# Patient Record
Sex: Female | Born: 1995 | Race: Black or African American | Hispanic: No | Marital: Single | Smoking: Never smoker
Health system: Southern US, Community
[De-identification: ages and names within clinical notes are randomized; demographics above are authoritative.]

---
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
## 2019-06-12 ENCOUNTER — Emergency Department (HOSPITAL_BASED_OUTPATIENT_CLINIC_OR_DEPARTMENT_OTHER): Payer: Self-pay

## 2019-06-12 ENCOUNTER — Emergency Department (HOSPITAL_BASED_OUTPATIENT_CLINIC_OR_DEPARTMENT_OTHER)
Admission: EM | Admit: 2019-06-12 | Discharge: 2019-06-12 | Disposition: A | Discharge status: Home / Self Care | Payer: Self-pay | Attending: Emergency Medicine | Admitting: Emergency Medicine

## 2019-06-12 ENCOUNTER — Other Ambulatory Visit: Payer: Self-pay

## 2019-06-12 ENCOUNTER — Encounter (HOSPITAL_BASED_OUTPATIENT_CLINIC_OR_DEPARTMENT_OTHER): Payer: Self-pay

## 2019-06-12 DIAGNOSIS — M545 Low back pain: Secondary | ICD-10-CM | POA: Insufficient documentation

## 2019-06-12 DIAGNOSIS — M25552 Pain in left hip: Secondary | ICD-10-CM | POA: Insufficient documentation

## 2019-06-12 DIAGNOSIS — M25572 Pain in left ankle and joints of left foot: Secondary | ICD-10-CM | POA: Diagnosis present

## 2019-06-12 DIAGNOSIS — R52 Pain, unspecified: Secondary | ICD-10-CM

## 2019-06-12 DIAGNOSIS — T148XXA Other injury of unspecified body region, initial encounter: Secondary | ICD-10-CM

## 2019-06-12 LAB — PREGNANCY, URINE: Preg Test, Ur: NEGATIVE
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
## 2019-06-12 NOTE — ED Notes (Signed)
Patient is A&Ox4 at this time.  Patient in no signs of distress.  Please see providers note for complete history and physical exam.  
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
## 2024-07-18 ENCOUNTER — Emergency Department (HOSPITAL_BASED_OUTPATIENT_CLINIC_OR_DEPARTMENT_OTHER)
Admission: EM | Admit: 2024-07-18 | Discharge: 2024-07-19 | Disposition: A | Discharge status: Home / Self Care | Payer: Self-pay | Attending: Emergency Medicine | Admitting: Emergency Medicine

## 2024-07-18 ENCOUNTER — Other Ambulatory Visit: Payer: Self-pay

## 2024-07-18 ENCOUNTER — Encounter (HOSPITAL_BASED_OUTPATIENT_CLINIC_OR_DEPARTMENT_OTHER): Payer: Self-pay

## 2024-07-18 DIAGNOSIS — K0889 Other specified disorders of teeth and supporting structures: Secondary | ICD-10-CM

## 2024-07-18 DIAGNOSIS — K029 Dental caries, unspecified: Secondary | ICD-10-CM | POA: Diagnosis not present

## 2024-07-18 MED ORDER — OXYCODONE-ACETAMINOPHEN 5-325 MG PO TABS: 1.0000 | ORAL_TABLET | Freq: Four times a day (QID) | ORAL | 0 refills | Status: AC | PRN

## 2024-07-18 MED ORDER — AMOXICILLIN 500 MG PO CAPS
500.0000 mg | ORAL_CAPSULE | Freq: Once | ORAL | Status: AC
  Administered 2024-07-18: 500 mg via ORAL
  Filled 2024-07-18: qty 1

## 2024-07-18 MED ORDER — KETOROLAC TROMETHAMINE 30 MG/ML IJ SOLN
30.0000 mg | Freq: Once | INTRAMUSCULAR | Status: AC
  Administered 2024-07-18: 30 mg via INTRAMUSCULAR
  Filled 2024-07-18: qty 1

## 2024-07-18 MED ORDER — AMOXICILLIN 500 MG PO CAPS: 500.0000 mg | ORAL_CAPSULE | Freq: Two times a day (BID) | ORAL | 0 refills | Status: AC

## 2024-07-18 NOTE — Discharge Instructions (Addendum)
 Is a pleasure taking care of you here today  You received a shot of pain medicine and your first dose of antibiotics.  Start taking the other antibiotics at home.  I have written you for a short course of pain medicine.  Please use caution as it is a controlled substance.  Does have the addictive potential.  Do not drive or operate heavy machinery while taking the medicine.

## 2024-07-18 NOTE — ED Triage Notes (Signed)
 Pt reports R upper jaw pain

## 2024-07-18 NOTE — ED Provider Notes (Signed)
 [place] [facility] AT [facility] Provider Note   CSN: [id number] Arrival date & time: 07/18/24  2249     Patient presents with: Dental Pain   Laurie Salazar is a 28 y.o. female here for evaluation of right upper dental pain.  Has been ongoing issue for patient.  Waxes and wanes.  Pain to right upper dentition.  Will radiate up into her face.  Has not been able to afford to see dentist outpatient for definitive management.  No facial swelling, fever.   HPI     Prior to Admission medications   Medication Sig Start Date End Date Taking? Authorizing Provider  amoxicillin (AMOXIL) 500 MG capsule Take 1 capsule (500 mg total) by mouth 2 (two) times daily for 7 days. 07/18/24 07/25/24 Yes Sherica Paternostro A, PA-C  oxyCODONE-acetaminophen (PERCOCET/ROXICET) 5-325 MG tablet Take 1 tablet by mouth every 6 (six) hours as needed for severe pain (pain score 7-10). 07/18/24  Yes Angelica Wix A, PA-C    Allergies: Patient has no known allergies.    Review of Systems  Constitutional: Negative.   HENT:  Positive for dental problem. Negative for congestion, ear discharge, ear pain, facial swelling, hearing loss, postnasal drip, rhinorrhea, sinus pressure, sinus pain, sneezing, sore throat, trouble swallowing and voice change.   All other systems reviewed and are negative.   Updated Vital Signs BP 121/75 (BP Location: Right Arm)   Pulse (!) 108   Temp 97.7 F (36.5 C)   Resp 18   Ht 5' 9 (1.753 m)   Wt 71.7 kg   SpO2 98%   BMI 23.33 kg/m   Physical Exam Vitals and nursing note reviewed.  Constitutional:      General: She is not in acute distress.    Appearance: She is well-developed. She is not ill-appearing.  HENT:     Head: Atraumatic.     Jaw: There is normal jaw occlusion.     Comments: No drooling, dysphagia or trismus.  No facial swelling.  Submandibular area soft    Mouth/Throat:     Mouth: Mucous membranes are moist.     Dentition: Abnormal  dentition. Does not have dentures. Dental tenderness and dental caries present. No gingival swelling or dental abscesses.     Pharynx: Oropharynx is clear. Uvula midline.      Comments: Dental carry right upper dentition with black spot at root.  Mild gingival erythema without periapical abscess. Eyes:     Pupils: Pupils are equal, round, and reactive to light.  Cardiovascular:     Rate and Rhythm: Normal rate.  Pulmonary:     Effort: No respiratory distress.  Abdominal:     General: There is no distension.  Musculoskeletal:        General: Normal range of motion.     Cervical back: Normal range of motion.  Skin:    General: Skin is warm and dry.  Neurological:     General: No focal deficit present.     Mental Status: She is alert.  Psychiatric:        Mood and Affect: Mood normal.     (all labs ordered are listed, but only abnormal results are displayed) Labs Reviewed - No data to display  EKG: None  Radiology: No results found.   Procedures   Medications Ordered in the ED  ketorolac (TORADOL) 30 MG/ML injection 30 mg (has no administration in time range)  amoxicillin (AMOXIL) capsule 500 mg (has no administration in time range)  28 year old here for evaluation of dental pain.  Has been ongoing issue for patient which waxes and wanes.  Most recent episode started on "Sunday.  Here she has no facial swelling.  I have low suspicion for deep space infection, Ludwig's angina.  No systemic symptoms.  She denies chance of pregnancy.  Will give IM Toradol here.  Will start on antibiotics. She has no evidence of drainable abscess at this time.  Will have her follow-up with dentistry, return for any worsening symptoms.  Do not feel she needs additional labs or imaging at this time.  The patient has been appropriately medically screened and/or stabilized in the ED. I have low suspicion for any other emergent medical condition which would require further screening, evaluation or  treatment in the ED or require inpatient management.  Patient is hemodynamically stable and in no acute distress.  Patient able to ambulate in department prior to ED.  Evaluation does not show acute pathology that would require ongoing or additional emergent interventions while in the emergency department or further inpatient treatment.  I have discussed the diagnosis with the patient and answered all questions.  Pain is been managed while in the emergency department and patient has no further complaints prior to discharge.  Patient is comfortable with plan discussed in room and is stable for discharge at this time.  I have discussed strict return precautions for returning to the emergency department.  Patient was encouraged to follow-up with PCP/specialist refer to at discharge.                                    Medical Decision Making Amount and/or Complexity of Data Reviewed External Data Reviewed: labs, radiology and notes.  Risk OTC drugs. Prescription drug management.       Final diagnoses:  Pain, dental    ED Discharge Orders          Ordered    oxyCODONE-acetaminophen (PERCOCET/ROXICET) 5-325 MG tablet  Every 6 hours PRN        07/18/24 2344    amoxicillin (AMOXIL) 500 MG capsule  2 times daily        11" /07/25 2344               Sarie Stall A, PA-C 07/18/24 2347    Ruthe Cornet, DO 07/21/24 1106

## 2024-07-25 ENCOUNTER — Encounter (HOSPITAL_BASED_OUTPATIENT_CLINIC_OR_DEPARTMENT_OTHER): Payer: Self-pay | Admitting: Emergency Medicine

## 2024-07-25 ENCOUNTER — Emergency Department (HOSPITAL_BASED_OUTPATIENT_CLINIC_OR_DEPARTMENT_OTHER)
Admission: EM | Admit: 2024-07-25 | Discharge: 2024-07-25 | Disposition: A | Discharge status: Home / Self Care | Attending: Emergency Medicine | Admitting: Emergency Medicine

## 2024-07-25 DIAGNOSIS — K029 Dental caries, unspecified: Secondary | ICD-10-CM | POA: Insufficient documentation

## 2024-07-25 DIAGNOSIS — K0889 Other specified disorders of teeth and supporting structures: Secondary | ICD-10-CM | POA: Diagnosis present

## 2024-07-25 MED ORDER — OXYCODONE-ACETAMINOPHEN 5-325 MG PO TABS
1.0000 | ORAL_TABLET | Freq: Once | ORAL | Status: AC
  Administered 2024-07-25: 1 via ORAL
  Filled 2024-07-25: qty 1

## 2024-07-25 MED ORDER — IBUPROFEN 600 MG PO TABS
600.0000 mg | ORAL_TABLET | Freq: Four times a day (QID) | ORAL | 0 refills | Status: AC | PRN
Start: 2024-07-25 — End: ?

## 2024-07-25 MED ORDER — KETOROLAC TROMETHAMINE 60 MG/2ML IM SOLN
30.0000 mg | Freq: Once | INTRAMUSCULAR | Status: AC
  Administered 2024-07-25: 30 mg via INTRAMUSCULAR
  Filled 2024-07-25: qty 2

## 2024-07-25 MED ORDER — AMOXICILLIN-POT CLAVULANATE 875-125 MG PO TABS: 1.0000 | ORAL_TABLET | Freq: Two times a day (BID) | ORAL | 0 refills | Status: AC

## 2024-07-25 NOTE — ED Triage Notes (Signed)
 Right upper tooth infection is spreading to lower jaw. Seen for same on 11/7.

## 2024-07-25 NOTE — ED Provider Notes (Signed)
 [place] EM[facility] AT [facility] Provider Note   CSN: [id number] Arrival date & time: 07/25/24  0424     Patient presents with: Dental Pain   Laurie Salazar is a 28 y.o. female.   The history is provided by the patient and medical records.  Dental Pain Laurie Salazar is a 28 y.o. female who presents to the Emergency Department complaining of dental pain.  She presents to the emergency department for evaluation of pain to her right upper tooth that started 2 months ago, worsening over the last week.  Her pain is now spreading to her temple.  No local swelling.  She has pain when air touches her tooth, chewing, cold or lying down.  No shortness of breath, fever.  She has no known medical problems and takes no routine medications.  She denies any chance of pregnancy.  She was started on antibiotics when she was seen in the emergency department but she did drink with them and feels like they did not work as well because of that.  She did not call to follow-up with dentistry as she is in between a dental insurance at this time.       Prior to Admission medications   Medication Sig Start Date End Date Taking? Authorizing Provider  amoxicillin-clavulanate (AUGMENTIN) 875-125 MG tablet Take 1 tablet by mouth every 12 (twelve) hours. 07/25/24  Yes Griselda Norris, MD  ibuprofen (ADVIL) 600 MG tablet Take 1 tablet (600 mg total) by mouth every 6 (six) hours as needed. 07/25/24  Yes Griselda Norris, MD  amoxicillin (AMOXIL) 500 MG capsule Take 1 capsule (500 mg total) by mouth 2 (two) times daily for 7 days. 07/18/24 07/25/24  Henderly, Britni A, PA-C  oxyCODONE-acetaminophen (PERCOCET/ROXICET) 5-325 MG tablet Take 1 tablet by mouth every 6 (six) hours as needed for severe pain (pain score 7-10). 07/18/24   Henderly, Britni A, PA-C    Allergies: Patient has no known allergies.    Review of Systems  All other systems reviewed and are negative.   Updated Vital  Signs BP 120/70 (BP Location: Right Arm)   Pulse (!) 110   Temp 98 F (36.7 C)   Resp 20   Ht 5' 9 (1.753 m)   Wt 71.6 kg   LMP 07/12/2024 (Exact Date)   SpO2 100%   BMI 23.31 kg/m   Physical Exam Vitals and nursing note reviewed.  Constitutional:      Appearance: She is well-developed.  HENT:     Head: Normocephalic and atraumatic.     Comments: There is a cavity at the base of tooth 5 with local tenderness to palpation without significant soft tissue swelling.  There is no fluctuance.  There are occasional scattered caries nearby.  No edema or erythema in the posterior oropharynx Cardiovascular:     Rate and Rhythm: Normal rate and regular rhythm.  Pulmonary:     Effort: Pulmonary effort is normal. No respiratory distress.     Breath sounds: No stridor.  Musculoskeletal:        General: No tenderness.  Skin:    General: Skin is warm and dry.  Neurological:     Mental Status: She is alert and oriented to person, place, and time.     Comments: No asymmetry of facial movements.  Psychiatric:        Behavior: Behavior normal.     (all labs ordered are listed, but only abnormal results are displayed) Labs Reviewed - No  data to display  EKG: None  Radiology: No results found.   Procedures   Medications Ordered in the ED  ketorolac (TORADOL) injection 30 mg (30 mg Intramuscular Given 07/25/24 0455)  oxyCODONE-acetaminophen (PERCOCET/ROXICET) 5-325 MG per tablet 1 tablet (1 tablet Oral Given 07/25/24 0454)                                    Medical Decision Making Risk Prescription drug management.   Patient here for evaluation of dental pain, has already completed a course of amoxicillin.  She does have a cavity present on tooth 5.  There is no evidence of abscess.  Discussed that she will need to follow-up with dentistry for definitive treatment of this tooth.  Will provide another course of antibiotics although there is no definite soft tissue infection at  this time.  Discussed pain control with NSAIDs-Will provide a prescription.  Discussed outpatient follow-up as well as return precautions.     Final diagnoses:  Pain due to dental caries    ED Discharge Orders          Ordered    amoxicillin-clavulanate (AUGMENTIN) 875-125 MG tablet  Every 12 hours        07/25/24 0443    ibuprofen (ADVIL) 600 MG tablet  Every 6 hours PRN        07/25/24 0443               Griselda Norris, MD 07/25/24 0505

## 2024-08-05 ENCOUNTER — Encounter (HOSPITAL_BASED_OUTPATIENT_CLINIC_OR_DEPARTMENT_OTHER): Payer: Self-pay

## 2024-08-05 ENCOUNTER — Other Ambulatory Visit: Payer: Self-pay

## 2024-08-05 ENCOUNTER — Emergency Department (HOSPITAL_BASED_OUTPATIENT_CLINIC_OR_DEPARTMENT_OTHER)
Admission: EM | Admit: 2024-08-05 | Discharge: 2024-08-05 | Disposition: A | Discharge status: Home / Self Care | Attending: Emergency Medicine | Admitting: Emergency Medicine

## 2024-08-05 DIAGNOSIS — R002 Palpitations: Secondary | ICD-10-CM | POA: Diagnosis present

## 2024-08-05 LAB — PREGNANCY, URINE: Preg Test, Ur: NEGATIVE

## 2024-08-05 NOTE — ED Provider Notes (Signed)
 Emergency Department Provider Note   I have reviewed the triage vital signs and the nursing notes.   HISTORY  Chief Complaint Homeless   HPI Laurie Salazar is a 28 y.o. female presents to emergency department with her child for evaluation of palpitations.  She has an appointment palpitations for the past days but mainly states she is here to try and get out of the cold.  She had dad with nothing related and likely some of the story advised ultrasound today but also department for tomorrow will reassure and intact clinically.  She states that the primary reason she is here.  No chest pain or shortness of breath.    History reviewed. No pertinent past medical history.  Review of Systems  Constitutional: No fever/chills Cardiovascular: Denies chest pain. Positive palpitations.  Respiratory: Denies shortness of breath. Gastrointestinal: No abdominal pain.  Neurological: Negative for headaches.  ____________________________________________   PHYSICAL EXAM:  VITAL SIGNS: ED Triage Vitals  Encounter Vitals Group     BP 08/05/24 0549 131/79     Pulse Rate 08/05/24 0549 (!) 121     Resp 08/05/24 0549 18     Temp 08/05/24 0549 98.2 F (36.8 C)     Temp Source 08/05/24 0549 Oral     SpO2 08/05/24 0549 100 %   Constitutional: Alert and oriented. Well appearing and in no acute distress. Eyes: Conjunctivae are normal.  Head: Atraumatic. Nose: No congestion/rhinnorhea. Mouth/Throat: Mucous membranes are moist.   Neck: No stridor.   Cardiovascular: Good peripheral circulation.  Respiratory: Normal respiratory effort.   Gastrointestinal: No distention.  Musculoskeletal: No gross deformities of extremities. Neurologic:  Normal speech and language.  Skin:  Skin is warm, dry and intact. No rash noted.  ____________________________________________   LABS (all labs ordered are listed, but only abnormal results are displayed)  Labs Reviewed  PREGNANCY, URINE    ____________________________________________  EKG   EKG Interpretation Date/Time:  Tuesday August 05 2024 06:05:25 EST Ventricular Rate:  111 PR Interval:  159 QRS Duration:  70 QT Interval:  327 QTC Calculation: 445 R Axis:   80  Text Interpretation: Sinus tachycardia Confirmed by Darra Chew [phone]) on 08/05/2024 6:23:59 AM       ____________________________________________   PROCEDURES  Procedure(s) performed:   Procedures  None ____________________________________________   INITIAL IMPRESSION / ASSESSMENT AND PLAN / ED COURSE  Pertinent labs & imaging results that were available during my care of the patient were reviewed by me and considered in my medical decision making (see chart for details).   This patient is Presenting for Evaluation of palpitations, which does require a range of treatment options, and is a complaint that involves a moderate risk of morbidity and mortality.  The Differential Diagnoses include arrhythmia, anxiety, homelessness, etc.  Clinical Laboratory Tests Ordered, included pregnancy which is negative.   Medical Decision Making: Summary:  Patient presents emergency department with palpitations.  Sinus tachycardia found on EKG.  Patient reports that the main reason that she came to the ED this morning was to try and get out of the cold.  Plan to provide a list of shelter resources.  I do not feel that additional ED workup is necessary.  Will provide some food and temporary relief from the weather.   Patient's presentation is most consistent with acute, uncomplicated illness.   Disposition: discharge  ____________________________________________  FINAL CLINICAL IMPRESSION(S) / ED DIAGNOSES  Final diagnoses:  Palpitations     Note:  This document was prepared  using Conservation officer, historic buildings and may include unintentional dictation errors.  Fonda Law, MD, [facility] Emergency Medicine    Alexsandria Kivett, Fonda MATSU, MD 08/05/24  [phone]

## 2024-08-05 NOTE — ED Triage Notes (Signed)
 Pt states that she is homeless and an officer told her to come here to get out the cold and see a child psychotherapist. Pt reports that she had some palpations yesterday.
